# Patient Record
Sex: Male | Born: 1970 | Race: White | Hispanic: No | Marital: Married | Smoking: Never smoker
Health system: Southern US, Community
[De-identification: ages and names within clinical notes are randomized; demographics above are authoritative.]

## PROBLEM LIST (undated history)

## (undated) DIAGNOSIS — I517 Cardiomegaly: Secondary | ICD-10-CM

## (undated) DIAGNOSIS — I10 Essential (primary) hypertension: Secondary | ICD-10-CM

## (undated) DIAGNOSIS — L989 Disorder of the skin and subcutaneous tissue, unspecified: Secondary | ICD-10-CM

## (undated) DIAGNOSIS — N529 Male erectile dysfunction, unspecified: Secondary | ICD-10-CM

## (undated) DIAGNOSIS — E7800 Pure hypercholesterolemia, unspecified: Secondary | ICD-10-CM

## (undated) DIAGNOSIS — K219 Gastro-esophageal reflux disease without esophagitis: Secondary | ICD-10-CM

## (undated) HISTORY — DX: Gastro-esophageal reflux disease without esophagitis: K21.9

## (undated) HISTORY — DX: Male erectile dysfunction, unspecified: N52.9

## (undated) HISTORY — DX: Essential (primary) hypertension: I10

## (undated) HISTORY — DX: Disorder of the skin and subcutaneous tissue, unspecified: L98.9

## (undated) HISTORY — DX: Pure hypercholesterolemia, unspecified: E78.00

## (undated) HISTORY — PX: NO PAST SURGERIES: SHX2092

## (undated) HISTORY — DX: Cardiomegaly: I51.7

---

## 2002-11-18 ENCOUNTER — Encounter: Payer: Self-pay | Admitting: Unknown Physician Specialty

## 2002-11-18 ENCOUNTER — Encounter: Admission: RE | Admit: 2002-11-18 | Discharge: 2002-11-18 | Payer: Self-pay | Admitting: Unknown Physician Specialty

## 2015-04-17 ENCOUNTER — Ambulatory Visit (INDEPENDENT_AMBULATORY_CARE_PROVIDER_SITE_OTHER): Payer: BC Managed Care – PPO | Admitting: Podiatry

## 2015-04-17 ENCOUNTER — Ambulatory Visit (INDEPENDENT_AMBULATORY_CARE_PROVIDER_SITE_OTHER): Payer: BC Managed Care – PPO

## 2015-04-17 ENCOUNTER — Ambulatory Visit: Payer: BC Managed Care – PPO

## 2015-04-17 DIAGNOSIS — M722 Plantar fascial fibromatosis: Secondary | ICD-10-CM

## 2015-04-17 DIAGNOSIS — R52 Pain, unspecified: Secondary | ICD-10-CM | POA: Diagnosis not present

## 2015-04-17 MED ORDER — DICLOFENAC SODIUM 75 MG PO TBEC: 75.0000 mg | DELAYED_RELEASE_TABLET | Freq: Two times a day (BID) | ORAL | Status: DC

## 2015-04-17 MED ORDER — TRIAMCINOLONE ACETONIDE 10 MG/ML IJ SUSP
10.0000 mg | Freq: Once | INTRAMUSCULAR | Status: AC
  Administered 2015-04-17: 10 mg

## 2015-04-17 NOTE — Patient Instructions (Signed)

## 2015-04-17 NOTE — Progress Notes (Signed)
   Subjective:    Patient ID: Derek Ware, male    DOB: March 29, 1971, 44 y.o.   MRN: [id number]  HPI  I have been having arch and heel pain in my right foot since October but have started to have some pain in my left recently.     Review of Systems  All other systems reviewed and are negative.      Objective:   Physical Exam        Assessment & Plan:

## 2015-04-17 NOTE — Progress Notes (Signed)
Subjective:     Patient ID: Derek Ware, male   DOB: 04/14/1971, 44 y.o.   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      Objective:   Physical Exam  Constitutional: He is oriented to person, place, and time.  Cardiovascular: Intact distal pulses.   Musculoskeletal: Normal range of motion.  Neurological: He is oriented to person, place, and time.  Skin: Skin is warm.  Nursing note and vitals reviewed.  neurovascular status intact muscle strength adequate range of motion within normal limits with quite of discomfort in the plantar aspect right heel with inflammation fluid around the medial band and moderate depression of the arch. Patient's noted to be well perfused and is well oriented 3     Assessment:     Acute plantar fasciitis with pain right medial heel and moderate discomfort in the left heel    Plan:     H&P and x-rays reviewed with patient. Injected the right plantar fascia 3 mg Kenalog 5 mg Xylocaine and applied fascial brace with instructions. Placed on diclofenac and gave instructions on physical therapy

## 2015-05-01 ENCOUNTER — Encounter: Payer: Self-pay | Admitting: Podiatry

## 2015-05-01 ENCOUNTER — Ambulatory Visit (INDEPENDENT_AMBULATORY_CARE_PROVIDER_SITE_OTHER): Payer: BC Managed Care – PPO | Admitting: Podiatry

## 2015-05-01 VITALS — BP 134/92 | HR 60 | Resp 15

## 2015-05-01 DIAGNOSIS — M722 Plantar fascial fibromatosis: Secondary | ICD-10-CM | POA: Diagnosis not present

## 2015-05-02 NOTE — Progress Notes (Signed)
Subjective:     Patient ID: Derek Ware, male   DOB: 1970-11-11, 44 y.o.   MRN: [id number]  HPI patient presents stating I'm doing quite a bit better with a diminishment of discomfort upon movement   Review of Systems     Objective:   Physical Exam Neurovascular status intact with continued discomfort in the mid arch area but doing well with minimal pain upon palpation    Assessment:     Improvement from fasciitis with pain still present upon deep palpation    Plan:     Advised on physical therapy anti-inflammatory treatment and supportive shoe gear usage. Reappoint as needed

## 2016-07-18 DIAGNOSIS — R079 Chest pain, unspecified: Secondary | ICD-10-CM

## 2016-07-18 DIAGNOSIS — I10 Essential (primary) hypertension: Secondary | ICD-10-CM

## 2016-07-18 HISTORY — DX: Chest pain, unspecified: R07.9

## 2016-07-18 HISTORY — DX: Essential (primary) hypertension: I10

## 2021-01-26 ENCOUNTER — Encounter: Payer: Self-pay | Admitting: *Deleted

## 2021-01-26 ENCOUNTER — Encounter: Payer: Self-pay | Admitting: Cardiology

## 2021-02-13 ENCOUNTER — Other Ambulatory Visit: Payer: Self-pay

## 2021-02-13 ENCOUNTER — Ambulatory Visit: Payer: No Typology Code available for payment source | Admitting: Cardiology

## 2021-02-13 ENCOUNTER — Encounter: Payer: Self-pay | Admitting: Cardiology

## 2021-02-13 VITALS — BP 138/70 | HR 64 | Ht 73.0 in | Wt 292.0 lb

## 2021-02-13 DIAGNOSIS — I10 Essential (primary) hypertension: Secondary | ICD-10-CM | POA: Diagnosis not present

## 2021-02-13 DIAGNOSIS — R079 Chest pain, unspecified: Secondary | ICD-10-CM | POA: Diagnosis not present

## 2021-02-13 DIAGNOSIS — E785 Hyperlipidemia, unspecified: Secondary | ICD-10-CM

## 2021-02-13 HISTORY — DX: Hyperlipidemia, unspecified: E78.5

## 2021-02-13 NOTE — Progress Notes (Signed)
Cardiology Consultation:    Date:  02/13/2021   ID:  Derek Ware, DOB 1970/11/17, MRN [id number]  PCP:  Rolm Gala, NP  Cardiologist:  Gypsy Balsam, MD   Referring MD: Rolm Gala, NP   Chief Complaint  Patient presents with  . Hypertension  . Chest Pain    Believe it was heart burn    History of Present Illness:    Derek Ware is a 50 y.o. male who is being seen today for the evaluation of chest pain at the request of Rolm Gala, NP.  Gentleman with past medical history significant for essential hypertension, dyslipidemia, obesity.  Comes today to my office to be reestablished as a patient previously he was managed for hypertension.  About 2 weeks ago on the weekend he was sitting watching TV and started having stabbing-like sensation in the epigastrium taking deep breaths make the sensation worse also leaning forward make the sensation worse this sensation was on and off for few days eventually he ended up going to his primary care physician he was given proton pump inhibitor and he became completely asymptomatic.  Interestingly even today is that he got those episodes of chest pain he was able to walk climb stairs with absolutely no difficulties.  He does have high blood pressure he wants to stay healthy and he exercise walking with his wife on the regular basis he have no difficulty doing it.  There is no exertional chest pain there is no shortness of breath no tightness squeezing pressure burning chest.  No palpitations. He never smoke He does have family members with coronary artery disease but not premature.  All of them were smokers. He is on diet trying to keep up with Mediterranean diet. He exercised on the regular basis by walking with his wife.  Past Medical History:  Diagnosis Date  . Chest pain in adult 07/18/2016  . Elevated LDL cholesterol level   . Essential hypertension   . GERD without esophagitis   . Left atrial enlargement   . Precancerous skin  lesion     Past Surgical History:  Procedure Laterality Date  . NO PAST SURGERIES      Current Medications: Current Meds  Medication Sig  . losartan (COZAAR) 25 MG tablet Take 25 mg by mouth daily.  . pantoprazole (PROTONIX) 20 MG tablet Take 20 mg by mouth daily.  . pravastatin (PRAVACHOL) 10 MG tablet Take 10 mg by mouth once a week.     Allergies:   Patient has no known allergies.   Social History   Socioeconomic History  . Marital status: Married    Spouse name: Not on file  . Number of children: Not on file  . Years of education: Not on file  . Highest education level: Not on file  Occupational History  . Not on file  Tobacco Use  . Smoking status: Never Smoker  . Smokeless tobacco: Never Used  Substance and Sexual Activity  . Alcohol use: Yes    Alcohol/week: 1.0 standard drink    Types: 1 Cans of beer per week  . Drug use: Never  . Sexual activity: Yes  Other Topics Concern  . Not on file  Social History Narrative  . Not on file   Social Determinants of Health   Financial Resource Strain: Not on file  Food Insecurity: Not on file  Transportation Needs: Not on file  Physical Activity: Not on file  Stress: Not on file  Social Connections: Not  on file     Family History: The patient's family history includes Cancer in his brother; Heart disease in his mother; Heart failure in his mother; Hypertension in his father; Irritable bowel syndrome in his sister; Lung cancer in his father. ROS:   Please see the history of present illness.    All 14 point review of systems negative except as described per history of present illness.  EKGs/Labs/Other Studies Reviewed:    The following studies were reviewed today:   EKG:  EKG is  ordered today.  The ekg ordered today demonstrates normal sinus rhythm, incomplete right bundle branch block, cannot rule out anterior wall MI  Recent Labs: No results found for requested labs within last 8760 hours.  Recent Lipid  Panel No results found for: CHOL, TRIG, HDL, CHOLHDL, VLDL, LDLCALC, LDLDIRECT  Physical Exam:    VS:  BP 138/70 (BP Location: Left Arm, Patient Position: Sitting)   Pulse 64   Ht 6\' 1"  (1.854 m)   Wt 292 lb (132.5 kg)   SpO2 94%   BMI 38.52 kg/m     Wt Readings from Last 3 Encounters:  02/13/21 292 lb (132.5 kg)  01/24/21 295 lb (133.8 kg)     GEN:  Well nourished, well developed in no acute distress HEENT: Normal NECK: No JVD; No carotid bruits LYMPHATICS: No lymphadenopathy CARDIAC: RRR, no murmurs, no rubs, no gallops RESPIRATORY:  Clear to auscultation without rales, wheezing or rhonchi  ABDOMEN: Soft, non-tender, non-distended MUSCULOSKELETAL:  No edema; No deformity  SKIN: Warm and dry NEUROLOGIC:  Alert and oriented x 3 PSYCHIATRIC:  Normal affect   ASSESSMENT:    1. Chest pain in adult   2. Primary hypertension   3. Dyslipidemia    PLAN:    In order of problems listed above:  1. Atypical chest pain not related to exercise and relieved by proton pump inhibitor now completely asymptomatic I have low level suspicion that that was coronary event.  He is on baby aspirin which I advised to continue, he is also on proton pump inhibitor.  As a part of correlation I offer him calcium score that will also help for prognostic standpoint reviewed. 2. Dyslipidemia he takes pravastatin 10 mg once a week after mid count of unusual very low-dose.  We did calculated his 10 years risk for coronary event it can barely 5% which is low to intermediate.  I think doing calcium score will help in order to determine how aggressive we need to be in terms of management of his cholesterol.  In the meantime I asked him to start taking pravastatin 10 mg every single day. 3. Essential hypertension his blood pressure seems to be relatively well Continue present management.  I will ask her to have an echocardiogram to rule out possibility of anterior wall MI that is suggested by EKG as well as to  check for left ventricle hypertrophy. 4. We spent a great deal of time talking about risk factors modifications.  We did talk about need to exercise which she rated as I have told him that he need to exercise a little bit more aggressively to at least be short of breath when he does not, we did also discussed 1 more time basic of Mediterranean diet as well as avoidance of salty food.   Medication Adjustments/Labs and Tests Ordered: Current medicines are reviewed at length with the patient today.  Concerns regarding medicines are outlined above.  No orders of the defined types were placed  in this encounter.  No orders of the defined types were placed in this encounter.   Signed, Georgeanna Lea, MD, [facility]. 02/13/2021 9:23 AM    [facility]

## 2021-02-13 NOTE — Patient Instructions (Signed)
Medication Instructions:  Your physician recommends that you continue on your current medications as directed. Please refer to the Current Medication list given to you today.  *If you need a refill on your cardiac medications before your next appointment, please call your pharmacy*   Lab Work: None If you have labs (blood work) drawn today and your tests are completely normal, you will receive your results only by: Marland Kitchen MyChart Message (if you have MyChart) OR . A paper copy in the mail If you have any lab test that is abnormal or we need to change your treatment, we will call you to review the results.   Testing/Procedures: Non-Cardiac CT scanning, (CAT scanning), is a noninvasive, special x-ray that produces cross-sectional images of the body using x-rays and a computer. CT scans help physicians diagnose and treat medical conditions. For some CT exams, a contrast material is used to enhance visibility in the area of the body being studied. CT scans provide greater clarity and reveal more details than regular x-ray exams.  Your physician has requested that you have an echocardiogram. Echocardiography is a painless test that uses sound waves to create images of your heart. It provides your doctor with information about the size and shape of your heart and how well your heart's chambers and valves are working. This procedure takes approximately one hour. There are no restrictions for this procedure.      Follow-Up: At [facility], you and your health needs are our priority.  As part of our continuing mission to provide you with exceptional heart care, we have created designated Provider Care Teams.  These Care Teams include your primary Cardiologist (physician) and Advanced Practice Providers (APPs -  Physician Assistants and Nurse Practitioners) who all work together to provide you with the care you need, when you need it.  We recommend signing up for the patient portal called "MyChart".   Sign up information is provided on this After Visit Summary.  MyChart is used to connect with patients for Virtual Visits (Telemedicine).  Patients are able to view lab/test results, encounter notes, upcoming appointments, etc.  Non-urgent messages can be sent to your provider as well.   To learn more about what you can do with MyChart, go to [email or web address].    Your next appointment:   4 month(s)  The format for your next appointment:   In Person  Provider:   Gypsy Balsam, MD   Other Instructions   Echocardiogram An echocardiogram is a test that uses sound waves (ultrasound) to produce images of the heart. Images from an echocardiogram can provide important information about:  Heart size and shape.  The size and thickness and movement of your heart's walls.  Heart muscle function and strength.  Heart valve function or if you have stenosis. Stenosis is when the heart valves are too narrow.  If blood is flowing backward through the heart valves (regurgitation).  A tumor or infectious growth around the heart valves.  Areas of heart muscle that are not working well because of poor blood flow or injury from a heart attack.  Aneurysm detection. An aneurysm is a weak or damaged part of an artery wall. The wall bulges out from the normal force of blood pumping through the body. Tell a health care provider about:  Any allergies you have.  All medicines you are taking, including vitamins, herbs, eye drops, creams, and over-the-counter medicines.  Any blood disorders you have.  Any surgeries you have had.  Any medical conditions  you have.  Whether you are pregnant or may be pregnant. What are the risks? Generally, this is a safe test. However, problems may occur, including an allergic reaction to dye (contrast) that may be used during the test. What happens before the test? No specific preparation is needed. You may eat and drink normally. What happens during the  test?  You will take off your clothes from the waist up and put on a hospital gown.  Electrodes or electrocardiogram (ECG)patches may be placed on your chest. The electrodes or patches are then connected to a device that monitors your heart rate and rhythm.  You will lie down on a table for an ultrasound exam. A gel will be applied to your chest to help sound waves pass through your skin.  A handheld device, called a transducer, will be pressed against your chest and moved over your heart. The transducer produces sound waves that travel to your heart and bounce back (or "echo" back) to the transducer. These sound waves will be captured in real-time and changed into images of your heart that can be viewed on a video monitor. The images will be recorded on a computer and reviewed by your health care provider.  You may be asked to change positions or hold your breath for a short time. This makes it easier to get different views or better views of your heart.  In some cases, you may receive contrast through an IV in one of your veins. This can improve the quality of the pictures from your heart. The procedure may vary among health care providers and hospitals.   What can I expect after the test? You may return to your normal, everyday life, including diet, activities, and medicines, unless your health care provider tells you not to do that. Follow these instructions at home:  It is up to you to get the results of your test. Ask your health care provider, or the department that is doing the test, when your results will be ready.  Keep all follow-up visits. This is important. Summary  An echocardiogram is a test that uses sound waves (ultrasound) to produce images of the heart.  Images from an echocardiogram can provide important information about the size and shape of your heart, heart muscle function, heart valve function, and other possible heart problems.  You do not need to do anything to  prepare before this test. You may eat and drink normally.  After the echocardiogram is completed, you may return to your normal, everyday life, unless your health care provider tells you not to do that. This information is not intended to replace advice given to you by your health care provider. Make sure you discuss any questions you have with your health care provider. Document Revised: 05/30/2020 Document Reviewed: 05/30/2020 Elsevier Patient Education  2021 [employer].

## 2021-02-23 ENCOUNTER — Other Ambulatory Visit: Payer: Self-pay

## 2021-02-23 ENCOUNTER — Ambulatory Visit (INDEPENDENT_AMBULATORY_CARE_PROVIDER_SITE_OTHER): Payer: No Typology Code available for payment source

## 2021-02-23 DIAGNOSIS — I10 Essential (primary) hypertension: Secondary | ICD-10-CM

## 2021-02-23 DIAGNOSIS — E785 Hyperlipidemia, unspecified: Secondary | ICD-10-CM

## 2021-02-23 DIAGNOSIS — R079 Chest pain, unspecified: Secondary | ICD-10-CM

## 2021-02-23 LAB — ECHOCARDIOGRAM COMPLETE
Area-P 1/2: 3.21 cm2
S' Lateral: 2.9 cm

## 2021-02-23 NOTE — Progress Notes (Signed)
Complete echocardiogram performed.  Jimmy Delmy Holdren RDCS, RVT  

## 2021-03-01 ENCOUNTER — Other Ambulatory Visit: Payer: Self-pay

## 2021-03-01 ENCOUNTER — Ambulatory Visit (INDEPENDENT_AMBULATORY_CARE_PROVIDER_SITE_OTHER)
Admission: RE | Admit: 2021-03-01 | Discharge: 2021-03-01 | Disposition: A | Discharge status: Home / Self Care | Payer: Self-pay | Source: Ambulatory Visit | Attending: Cardiology | Admitting: Cardiology

## 2021-03-01 DIAGNOSIS — R079 Chest pain, unspecified: Secondary | ICD-10-CM

## 2021-03-26 ENCOUNTER — Other Ambulatory Visit: Payer: Self-pay

## 2021-03-26 ENCOUNTER — Ambulatory Visit: Payer: No Typology Code available for payment source | Admitting: Cardiology

## 2021-03-26 DIAGNOSIS — I517 Cardiomegaly: Secondary | ICD-10-CM | POA: Insufficient documentation

## 2021-03-26 DIAGNOSIS — I10 Essential (primary) hypertension: Secondary | ICD-10-CM | POA: Insufficient documentation

## 2021-03-26 DIAGNOSIS — K219 Gastro-esophageal reflux disease without esophagitis: Secondary | ICD-10-CM | POA: Insufficient documentation

## 2021-03-26 DIAGNOSIS — E7800 Pure hypercholesterolemia, unspecified: Secondary | ICD-10-CM | POA: Insufficient documentation

## 2021-03-26 DIAGNOSIS — L989 Disorder of the skin and subcutaneous tissue, unspecified: Secondary | ICD-10-CM | POA: Insufficient documentation

## 2021-03-28 ENCOUNTER — Encounter: Payer: Self-pay | Admitting: Cardiology

## 2021-03-28 ENCOUNTER — Ambulatory Visit: Payer: No Typology Code available for payment source | Admitting: Cardiology

## 2021-03-28 ENCOUNTER — Other Ambulatory Visit: Payer: Self-pay

## 2021-03-28 VITALS — BP 138/72 | HR 71 | Ht 73.0 in | Wt 288.0 lb

## 2021-03-28 DIAGNOSIS — I10 Essential (primary) hypertension: Secondary | ICD-10-CM | POA: Diagnosis not present

## 2021-03-28 DIAGNOSIS — E785 Hyperlipidemia, unspecified: Secondary | ICD-10-CM | POA: Diagnosis not present

## 2021-03-28 DIAGNOSIS — I2510 Atherosclerotic heart disease of native coronary artery without angina pectoris: Secondary | ICD-10-CM

## 2021-03-28 DIAGNOSIS — E7800 Pure hypercholesterolemia, unspecified: Secondary | ICD-10-CM | POA: Diagnosis not present

## 2021-03-28 HISTORY — DX: Atherosclerotic heart disease of native coronary artery without angina pectoris: I25.10

## 2021-03-28 NOTE — Progress Notes (Signed)
Cardiology Office Note:    Date:  03/28/2021   ID:  Derek Ware, DOB 1970-11-14, MRN [id number]  PCP:  Rolm Gala, NP  Cardiologist:  Gypsy Balsam, MD    Referring MD: Rolm Gala, NP   Chief Complaint  Patient presents with  . Follow-up    results  I am doing fine  History of Present Illness:    Derek Ware is a 50 y.o. male who was referred to [place] originally because of atypical chest pain.  However he was given proton pump inhibitor all his symptoms resolved completely.  However as a part of evaluation he ended up having calcium score of his coronary arteries calcium score came 155 which is elevated.  I brought him to my office to talk about it.  Overall he is doing well he exercised on the regular basis have Ware difficulty doing it.  Denies have any chest pain tightness squeezing pressure burning chest.  Also last time I seen him he was taking 10 mg of pravastatin maybe twice a week I encouraged him to take it every single day which he does.  Concerning is the fact that CT of the chest noncardiac portion of showed some evidence of either pneumonitis may be been ongoing pneumonia however he was completely asymptomatic when the test was done.  I strongly recommended for him to talk to primary care physician about it.  Past Medical History:  Diagnosis Date  . Chest pain in adult 07/18/2016  . Elevated LDL cholesterol level   . Essential hypertension   . GERD without esophagitis   . Left atrial enlargement   . Precancerous skin lesion     Past Surgical History:  Procedure Laterality Date  . Ware PAST SURGERIES      Current Medications: Current Meds  Medication Sig  . aspirin EC 81 MG tablet Take 81 mg by mouth daily. Swallow whole.  . brimonidine (ALPHAGAN) 0.2 % ophthalmic solution Place 1 drop into the left eye in the morning, at noon, and at bedtime.  Marland Kitchen losartan (COZAAR) 25 MG tablet Take 25 mg by mouth daily.  . pravastatin (PRAVACHOL) 10 MG tablet Take 10 mg by  mouth once a week.  . [DISCONTINUED] pantoprazole (PROTONIX) 20 MG tablet Take 20 mg by mouth daily.     Allergies:   Patient has Ware known allergies.   Social History   Socioeconomic History  . Marital status: Married    Spouse name: Not on file  . Number of children: Not on file  . Years of education: Not on file  . Highest education level: Not on file  Occupational History  . Not on file  Tobacco Use  . Smoking status: Never Smoker  . Smokeless tobacco: Never Used  Substance and Sexual Activity  . Alcohol use: Yes    Alcohol/week: 1.0 standard drink    Types: 1 Cans of beer per week  . Drug use: Never  . Sexual activity: Yes  Other Topics Concern  . Not on file  Social History Narrative  . Not on file   Social Determinants of Health   Financial Resource Strain: Not on file  Food Insecurity: Not on file  Transportation Needs: Not on file  Physical Activity: Not on file  Stress: Not on file  Social Connections: Not on file     Family History: The patient's family history includes Cancer in his brother; Heart disease in his mother; Heart failure in his mother; Hypertension in his father;  Irritable bowel syndrome in his sister; Lung cancer in his father. ROS:   Please see the history of present illness.    All 14 point review of systems negative except as described per history of present illness  EKGs/Labs/Other Studies Reviewed:      Recent Labs: Ware results found for requested labs within last 8760 hours.  Recent Lipid Panel Ware results found for: CHOL, TRIG, HDL, CHOLHDL, VLDL, LDLCALC, LDLDIRECT  Physical Exam:    VS:  BP 138/72 (BP Location: Right Arm, Patient Position: Sitting)   Pulse 71   Ht 6\' 1"  (1.854 m)   Wt 288 lb (130.6 kg)   SpO2 94%   BMI 38.00 kg/m     Wt Readings from Last 3 Encounters:  03/28/21 288 lb (130.6 kg)  02/13/21 292 lb (132.5 kg)  01/24/21 295 lb (133.8 kg)     GEN:  Well nourished, well developed in Ware acute  distress HEENT: Normal NECK: Ware JVD; Ware carotid bruits LYMPHATICS: Ware lymphadenopathy CARDIAC: RRR, Ware murmurs, Ware rubs, Ware gallops RESPIRATORY:  Clear to auscultation without rales, wheezing or rhonchi  ABDOMEN: Soft, non-tender, non-distended MUSCULOSKELETAL:  Ware edema; Ware deformity  SKIN: Warm and dry LOWER EXTREMITIES: Ware swelling NEUROLOGIC:  Alert and oriented x 3 PSYCHIATRIC:  Normal affect   ASSESSMENT:    1. Dyslipidemia   2. Coronary artery disease involving native coronary artery of native heart without angina pectoris   3. Essential hypertension   4. Elevated LDL cholesterol level    PLAN:    In order of problems listed above:  1. Coronary artery disease calcium score 155, completely asymptomatic.  He is on aspirin as well as statin which I will continue.  In about 3 weeks we will repeat his cholesterol to make sure that he is dose of pravastatin which is really very small insufficient my feeling is that we will have to go to a higher dose. 2. Essential hypertension blood pressure well controlled continue present management. 3. Dyslipidemia again plan is to recheck his fasting lipid profile when he takes full dose of statin. 4. Abnormality in the lung parenchyma detected on CT.  I strongly recommend to talk to primary care physician about this I gave him a Paper copy of his test and ask him to show it to his primary care physician.  I expect him to need to see pulmonologist   Medication Adjustments/Labs and Tests Ordered: Current medicines are reviewed at length with the patient today.  Concerns regarding medicines are outlined above.  Orders Placed This Encounter  Procedures  . Lipid panel   Medication changes: Ware orders of the defined types were placed in this encounter.   Signed, 03/26/21, MD, [facility] 03/28/2021 3:19 PM    [facility]

## 2021-03-28 NOTE — Patient Instructions (Signed)
Medication Instructions:  Your physician recommends that you continue on your current medications as directed. Please refer to the Current Medication list given to you today.  *If you need a refill on your cardiac medications before your next appointment, please call your pharmacy*   Lab Work: Your physician recommends that you return for lab work in  6 weeks: lipid  If you have labs (blood work) drawn today and your tests are completely normal, you will receive your results only by: MyChart Message (if you have MyChart) OR A paper copy in the mail If you have any lab test that is abnormal or we need to change your treatment, we will call you to review the results.   Testing/Procedures: None   Follow-Up: At [facility], you and your health needs are our priority.  As part of our continuing mission to provide you with exceptional heart care, we have created designated Provider Care Teams.  These Care Teams include your primary Cardiologist (physician) and Advanced Practice Providers (APPs -  Physician Assistants and Nurse Practitioners) who all work together to provide you with the care you need, when you need it.  We recommend signing up for the patient portal called "MyChart".  Sign up information is provided on this After Visit Summary.  MyChart is used to connect with patients for Virtual Visits (Telemedicine).  Patients are able to view lab/test results, encounter notes, upcoming appointments, etc.  Non-urgent messages can be sent to your provider as well.   To learn more about what you can do with MyChart, go to [email or web address].    Your next appointment:   6 month(s)  The format for your next appointment:   In Person  Provider:   Robert Krasowski, MD   Other Instructions   

## 2021-06-29 ENCOUNTER — Ambulatory Visit: Payer: No Typology Code available for payment source | Admitting: Cardiology

## 2021-07-27 ENCOUNTER — Ambulatory Visit: Payer: BC Managed Care – PPO | Admitting: Cardiology

## 2021-07-27 ENCOUNTER — Other Ambulatory Visit: Payer: Self-pay

## 2021-07-27 ENCOUNTER — Encounter: Payer: Self-pay | Admitting: Cardiology

## 2021-07-27 VITALS — BP 140/90 | HR 65 | Ht 73.0 in | Wt 260.4 lb

## 2021-07-27 DIAGNOSIS — I2510 Atherosclerotic heart disease of native coronary artery without angina pectoris: Secondary | ICD-10-CM

## 2021-07-27 DIAGNOSIS — I10 Essential (primary) hypertension: Secondary | ICD-10-CM | POA: Diagnosis not present

## 2021-07-27 DIAGNOSIS — E785 Hyperlipidemia, unspecified: Secondary | ICD-10-CM | POA: Diagnosis not present

## 2021-07-27 NOTE — Progress Notes (Signed)
Cardiology Office Note:    Date:  07/27/2021   ID:  Derek Ware, DOB 1971-07-08, MRN [id number]  PCP:  Rolm Gala, NP  Cardiologist:  Gypsy Balsam, MD    Referring MD: Rolm Gala, NP   Chief Complaint  Patient presents with   Follow-up  I am doing very well  History of Present Illness:    Derek Ware is a 50 y.o. male  who was referred to [place] originally because of atypical chest pain.  However he was given proton pump inhibitor all his symptoms resolved completely.  However as a part of evaluation he ended up having calcium score of his coronary arteries calcium score came 155 which is elevated.  I brought him to my office to talk about it.  Overall he is doing well he exercised on the regular basis have no difficulty doing it.  Denies have any chest pain tightness squeezing pressure burning chest.  Also last time I seen him he was taking 10 mg of pravastatin maybe twice a week advised him at that time to start taking every single day but today he comes he tells me he does take it only once a week.  I asked him why is that he said because he does not have any problem I explained to him 1 more time what we find on the CT calcium score being 155 I have explained 1 more time that reduction of cholesterol which is elevated in his case will be very beneficial.  Hopefully he will comply I advised him of course to take pravastatin every single day.  Past Medical History:  Diagnosis Date   Chest pain in adult 07/18/2016   Elevated LDL cholesterol level    Essential hypertension    GERD without esophagitis    Left atrial enlargement    Precancerous skin lesion     Past Surgical History:  Procedure Laterality Date   NO PAST SURGERIES      Current Medications: Current Meds  Medication Sig   aspirin EC 81 MG tablet Take 81 mg by mouth daily. Swallow whole.   brimonidine (ALPHAGAN) 0.2 % ophthalmic solution Place 1 drop into the left eye in the morning, at noon, and at bedtime.    losartan (COZAAR) 25 MG tablet Take 25 mg by mouth daily.   pravastatin (PRAVACHOL) 10 MG tablet Take 10 mg by mouth once a week.     Allergies:   Patient has no known allergies.   Social History   Socioeconomic History   Marital status: Married    Spouse name: Not on file   Number of children: Not on file   Years of education: Not on file   Highest education level: Not on file  Occupational History   Not on file  Tobacco Use   Smoking status: Never   Smokeless tobacco: Never  Substance and Sexual Activity   Alcohol use: Yes    Alcohol/week: 1.0 standard drink    Types: 1 Cans of beer per week   Drug use: Never   Sexual activity: Yes  Other Topics Concern   Not on file  Social History Narrative   Not on file   Social Determinants of Health   Financial Resource Strain: Not on file  Food Insecurity: Not on file  Transportation Needs: Not on file  Physical Activity: Not on file  Stress: Not on file  Social Connections: Not on file     Family History: The patient's family history includes Cancer  in his brother; Heart disease in his mother; Heart failure in his mother; Hypertension in his father; Irritable bowel syndrome in his sister; Lung cancer in his father. ROS:   Please see the history of present illness.    All 14 point review of systems negative except as described per history of present illness  EKGs/Labs/Other Studies Reviewed:      Recent Labs: No results found for requested labs within last 8760 hours.  Recent Lipid Panel No results found for: CHOL, TRIG, HDL, CHOLHDL, VLDL, LDLCALC, LDLDIRECT  Physical Exam:    VS:  BP 140/90 (BP Location: Left Arm, Patient Position: Sitting)   Pulse 65   Ht 6\' 1"  (1.854 m)   Wt 260 lb 6.4 oz (118.1 kg)   SpO2 95%   BMI 34.36 kg/m     Wt Readings from Last 3 Encounters:  07/27/21 260 lb 6.4 oz (118.1 kg)  03/28/21 288 lb (130.6 kg)  02/13/21 292 lb (132.5 kg)     GEN:  Well nourished, well developed in  no acute distress HEENT: Normal NECK: No JVD; No carotid bruits LYMPHATICS: No lymphadenopathy CARDIAC: RRR, no murmurs, no rubs, no gallops RESPIRATORY:  Clear to auscultation without rales, wheezing or rhonchi  ABDOMEN: Soft, non-tender, non-distended MUSCULOSKELETAL:  No edema; No deformity  SKIN: Warm and dry LOWER EXTREMITIES: no swelling NEUROLOGIC:  Alert and oriented x 3 PSYCHIATRIC:  Normal affect   ASSESSMENT:    1. Coronary artery disease involving native coronary artery of native heart without angina pectoris   2. Primary hypertension   3. Dyslipidemia    PLAN:    In order of problems listed above:  Coronary disease calcification of the coronary arteries likely asymptomatic he is risk factors modification he does not take statin I strongly advised him to start taking pravastatin every single day we will recheck his fasting lipid profile 6 weeks Essential hypertension elevated but he said it is always elevated in in the morning he is taking Cozaar already will increase dose to 50 mg daily Dyslipidemia plan as described above, I did review his K PN which show me LDL of 134 HDL 59 will increase dose of pravastatin   Medication Adjustments/Labs and Tests Ordered: Current medicines are reviewed at length with the patient today.  Concerns regarding medicines are outlined above.  No orders of the defined types were placed in this encounter.  Medication changes: No orders of the defined types were placed in this encounter.   Signed, 02/15/21, MD, [facility] 07/27/2021 9:47 AM    [facility]

## 2021-07-27 NOTE — Patient Instructions (Signed)
Medication Instructions:  Your physician has recommended you make the following change in your medication:  INCREASE: Pravastatin 10 mg take one tablet by mouth daily.  *If you need a refill on your cardiac medications before your next appointment, please call your pharmacy*   Lab Work: Your physician recommends that you return for lab work in: 6 weeks AST, ALT, Lipids If you have labs (blood work) drawn today and your tests are completely normal, you will receive your results only by: MyChart Message (if you have MyChart) OR A paper copy in the mail If you have any lab test that is abnormal or we need to change your treatment, we will call you to review the results.   Testing/Procedures: None   Follow-Up: At [facility], you and your health needs are our priority.  As part of our continuing mission to provide you with exceptional heart care, we have created designated Provider Care Teams.  These Care Teams include your primary Cardiologist (physician) and Advanced Practice Providers (APPs -  Physician Assistants and Nurse Practitioners) who all work together to provide you with the care you need, when you need it.  We recommend signing up for the patient portal called "MyChart".  Sign up information is provided on this After Visit Summary.  MyChart is used to connect with patients for Virtual Visits (Telemedicine).  Patients are able to view lab/test results, encounter notes, upcoming appointments, etc.  Non-urgent messages can be sent to your provider as well.   To learn more about what you can do with MyChart, go to [email or web address].    Your next appointment:   6 month(s)  The format for your next appointment:   In Person  Provider:   Gypsy Balsam, MD   Other Instructions

## 2021-09-28 ENCOUNTER — Ambulatory Visit: Payer: No Typology Code available for payment source | Admitting: Cardiology

## 2022-01-24 ENCOUNTER — Ambulatory Visit: Payer: BC Managed Care – PPO | Admitting: Cardiology

## 2022-01-24 ENCOUNTER — Encounter: Payer: Self-pay | Admitting: Cardiology

## 2022-01-24 VITALS — BP 128/72 | HR 71 | Ht 73.0 in | Wt 282.4 lb

## 2022-01-24 DIAGNOSIS — I2510 Atherosclerotic heart disease of native coronary artery without angina pectoris: Secondary | ICD-10-CM

## 2022-01-24 DIAGNOSIS — I10 Essential (primary) hypertension: Secondary | ICD-10-CM | POA: Diagnosis not present

## 2022-01-24 DIAGNOSIS — E785 Hyperlipidemia, unspecified: Secondary | ICD-10-CM

## 2022-01-24 NOTE — Progress Notes (Signed)
?Cardiology Office Note:   ? ?Date:  01/24/2022  ? ?ID:  Derek Ware, DOB 1971-10-10, MRN [id number] ? ?PCP:  Rolm Gala, NP  ?Cardiologist:  Gypsy Balsam, MD   ? ?Referring MD: Rolm Gala, NP  ? ?Chief Complaint  ?Patient presents with  ? Follow-up  ?I am doing fine ? ?History of Present Illness:   ? ?Derek Ware is a 51 y.o. male with a past medical history significant for elevated calcium score of 155 in this 50 nasal gentleman, essential hypertension, dyslipidemia.  He comes today to my office for regular follow-up.  Overall things are well.  He denies have any chest pain tightness squeezing pressure burning chest he can walk with no difficulty except for the fungal few weeks ago he injured his little toe on the right food he thinks it is broken gradually getting better and he is gradually getting back to his exercises.  Denies have any chest pain tightness squeezing pressure burning chest. ? ?Past Medical History:  ?Diagnosis Date  ? Chest pain in adult 07/18/2016  ? Elevated LDL cholesterol level   ? Essential hypertension   ? GERD without esophagitis   ? Left atrial enlargement   ? Precancerous skin lesion   ? ? ?Past Surgical History:  ?Procedure Laterality Date  ? NO PAST SURGERIES    ? ? ?Current Medications: ?Current Meds  ?Medication Sig  ? aspirin EC 81 MG tablet Take 81 mg by mouth daily. Swallow whole.  ? losartan (COZAAR) 25 MG tablet Take 25 mg by mouth daily.  ? pravastatin (PRAVACHOL) 10 MG tablet Take 10 mg by mouth daily.  ? [DISCONTINUED] brimonidine (ALPHAGAN) 0.2 % ophthalmic solution Place 1 drop into the left eye in the morning, at noon, and at bedtime.  ?  ? ?Allergies:   Patient has no known allergies.  ? ?Social History  ? ?Socioeconomic History  ? Marital status: Married  ?  Spouse name: Not on file  ? Number of children: Not on file  ? Years of education: Not on file  ? Highest education level: Not on file  ?Occupational History  ? Not on file  ?Tobacco Use  ? Smoking  status: Never  ? Smokeless tobacco: Never  ?Substance and Sexual Activity  ? Alcohol use: Yes  ?  Alcohol/week: 1.0 standard drink  ?  Types: 1 Cans of beer per week  ? Drug use: Never  ? Sexual activity: Yes  ?Other Topics Concern  ? Not on file  ?Social History Narrative  ? Not on file  ? ?Social Determinants of Health  ? ?Financial Resource Strain: Not on file  ?Food Insecurity: Not on file  ?Transportation Needs: Not on file  ?Physical Activity: Not on file  ?Stress: Not on file  ?Social Connections: Not on file  ?  ? ?Family History: ?The patient's family history includes Cancer in his brother; Heart disease in his mother; Heart failure in his mother; Hypertension in his father; Irritable bowel syndrome in his sister; Lung cancer in his father. ?ROS:   ?Please see the history of present illness.    ?All 14 point review of systems negative except as described per history of present illness ? ?EKGs/Labs/Other Studies Reviewed:   ? ? ? ?Recent Labs: ?No results found for requested labs within last 8760 hours.  ?Recent Lipid Panel ?No results found for: CHOL, TRIG, HDL, CHOLHDL, VLDL, LDLCALC, LDLDIRECT ? ?Physical Exam:   ? ?VS:  BP 128/72 (BP Location: Left Arm,  Patient Position: Sitting)   Pulse 71   Ht 6\' 1"  (1.854 m)   Wt 282 lb 6.4 oz (128.1 kg)   SpO2 95%   BMI 37.26 kg/m?    ? ?Wt Readings from Last 3 Encounters:  ?01/24/22 282 lb 6.4 oz (128.1 kg)  ?07/27/21 260 lb 6.4 oz (118.1 kg)  ?03/28/21 288 lb (130.6 kg)  ?  ? ?GEN:  Well nourished, well developed in no acute distress ?HEENT: Normal ?NECK: No JVD; No carotid bruits ?LYMPHATICS: No lymphadenopathy ?CARDIAC: RRR, no murmurs, no rubs, no gallops ?RESPIRATORY:  Clear to auscultation without rales, wheezing or rhonchi  ?ABDOMEN: Soft, non-tender, non-distended ?MUSCULOSKELETAL:  No edema; No deformity  ?SKIN: Warm and dry ?LOWER EXTREMITIES: no swelling ?NEUROLOGIC:  Alert and oriented x 3 ?PSYCHIATRIC:  Normal affect  ? ?ASSESSMENT:   ? ?1.  Coronary artery disease involving native coronary artery of native heart without angina pectoris   ?2. Primary hypertension   ?3. Dyslipidemia   ? ?PLAN:   ? ?In order of problems listed above: ? ?Coronary disease as proven by calcium score being positive.  He is completely asymptomatic decreased risk factors modifications, he is already on antiplatelet therapy in form of aspirin 81 which I recommend to continue.  He is also on statin before he had difficulty tolerating it now he is taking 10 mg pravastatin daily we will recheck his fasting lipid profile.  I did review K PN which show me his LDL 134 HDL 59 but this is from year ago.  Again that study will be repeated. ?Essential hypertension: Blood pressure well controlled continue present management. ?Dyslipidemia: Plan as described above. ?We discussed healthy lifestyle need to exercise on the regular basis and good diet which she is already practicing.  He does not smoke ? ? ?Medication Adjustments/Labs and Tests Ordered: ?Current medicines are reviewed at length with the patient today.  Concerns regarding medicines are outlined above.  ?No orders of the defined types were placed in this encounter. ? ?Medication changes: No orders of the defined types were placed in this encounter. ? ? ?Signed, ?05/28/21, MD, [facility] ?01/24/2022 8:48 AM    ?[facility] ?

## 2022-01-24 NOTE — Patient Instructions (Signed)
Medication Instructions:  ?Your physician recommends that you continue on your current medications as directed. Please refer to the Current Medication list given to you today. ? ?*If you need a refill on your cardiac medications before your next appointment, please call your pharmacy* ? ? ?Lab Work: ?Your physician recommends that you return for lab work in:  ? ?Labs today: Lipid, LFT's ? ?If you have labs (blood work) drawn today and your tests are completely normal, you will receive your results only by: ?MyChart Message (if you have MyChart) OR ?A paper copy in the mail ?If you have any lab test that is abnormal or we need to change your treatment, we will call you to review the results. ? ? ?Testing/Procedures: ?None ? ? ?Follow-Up: ?At [facility], you and your health needs are our priority.  As part of our continuing mission to provide you with exceptional heart care, we have created designated Provider Care Teams.  These Care Teams include your primary Cardiologist (physician) and Advanced Practice Providers (APPs -  Physician Assistants and Nurse Practitioners) who all work together to provide you with the care you need, when you need it. ? ?We recommend signing up for the patient portal called "MyChart".  Sign up information is provided on this After Visit Summary.  MyChart is used to connect with patients for Virtual Visits (Telemedicine).  Patients are able to view lab/test results, encounter notes, upcoming appointments, etc.  Non-urgent messages can be sent to your provider as well.   ?To learn more about what you can do with MyChart, go to [email or web address].   ? ?Your next appointment:   ?1 year(s) ? ?The format for your next appointment:   ?In Person ? ?Provider:   ?Jenne Campus, MD  ? ? ?Other Instructions ?None ? ?

## 2022-01-25 ENCOUNTER — Telehealth: Payer: Self-pay

## 2022-01-25 DIAGNOSIS — E785 Hyperlipidemia, unspecified: Secondary | ICD-10-CM

## 2022-01-25 LAB — HEPATIC FUNCTION PANEL
ALT: 38 IU/L (ref 0–44)
AST: 34 IU/L (ref 0–40)
Albumin: 4.5 g/dL (ref 3.8–4.9)
Alkaline Phosphatase: 56 IU/L (ref 44–121)
Bilirubin Total: 0.4 mg/dL (ref 0.0–1.2)
Bilirubin, Direct: 0.12 mg/dL (ref 0.00–0.40)
Total Protein: 6.4 g/dL (ref 6.0–8.5)

## 2022-01-25 LAB — LIPID PANEL
Chol/HDL Ratio: 4.3 ratio (ref 0.0–5.0)
Cholesterol, Total: 239 mg/dL — ABNORMAL HIGH (ref 100–199)
HDL: 55 mg/dL (ref >39)
LDL Chol Calc (NIH): 172 mg/dL — ABNORMAL HIGH (ref 0–99)
Triglycerides: 71 mg/dL (ref 0–149)
VLDL Cholesterol Cal: 12 mg/dL (ref 5–40)

## 2022-01-25 MED ORDER — PRAVASTATIN SODIUM 80 MG PO TABS: 80.0000 mg | ORAL_TABLET | Freq: Every evening | ORAL | 1 refills | Status: DC

## 2022-01-25 NOTE — Telephone Encounter (Signed)
-----   Message from Robert J Krasowski, MD sent at 01/25/2022  9:46 AM EDT ----- ?Cholesterol far from where it supposed to be.  Please increase his pravastatin to 80 mg daily, fasting lipid profile, AST LT within 6 weeks ?

## 2022-01-25 NOTE — Telephone Encounter (Signed)
Patient notified of results and recommendations. Rx sent and he is aware to be fasting for blood work.  ?

## 2022-01-25 NOTE — Telephone Encounter (Signed)
-----   Message from Park Liter, MD sent at 01/25/2022  9:46 AM EDT ----- ?Cholesterol far from where it supposed to be.  Please increase his pravastatin to 80 mg daily, fasting lipid profile, AST LT within 6 weeks ?

## 2022-09-13 IMAGING — CT CT CARDIAC CORONARY ARTERY CALCIUM SCORE
3 series · 14 of 20 positions shown, 15 images · non-contrast
Comparison: None.
COMPARISON: None.

Addendum:
EXAM:
OVER-READ INTERPRETATION  CT CHEST

The following report is an over-read performed by radiologist Dr.
Clerio Lv [REDACTED] on 03/01/2021. This
over-read does not include interpretation of cardiac or coronary
anatomy or pathology. The coronary calcium score interpretation by
the cardiologist is attached.
CLINICAL DATA: 49M with hypertension, hyperlipidemia and atypical
chest pain for cardiovascular disease risk stratification
Coronary Calcium Score
TECHNIQUE: A gated, non-contrast computed tomography scan of the heart was
performed using 3mm slice thickness. Axial images were analyzed on a
dedicated workstation. Calcium scoring of the coronary arteries was
performed using the Agatston method.

[Series 2: casc 3.0 bv41 2 bestdiast 70 % · axial · 0.46mm/px · z∈[+210,+297]mm · 4 of 49 slices shown, 5 images]
[im 10/49  vessel]
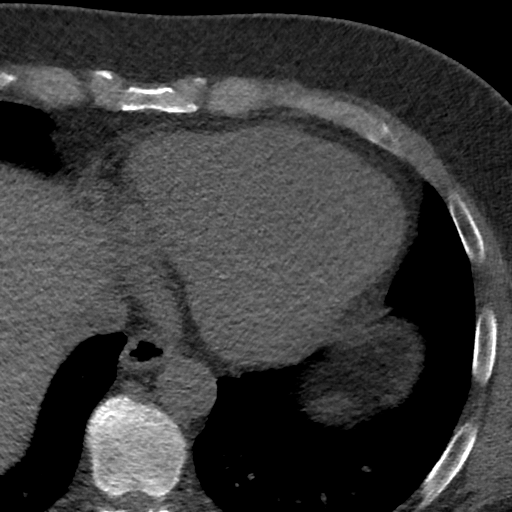
[im 10/49  lung]
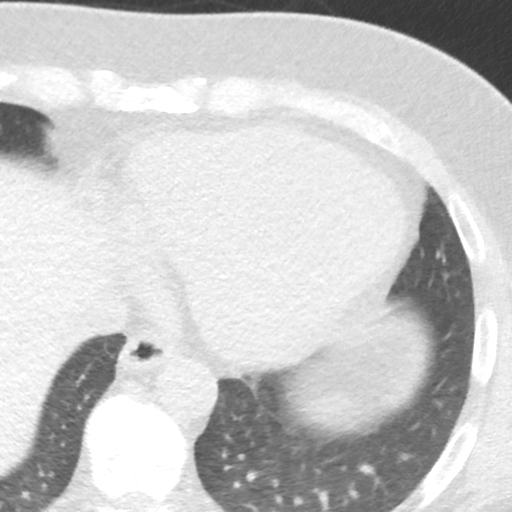
[im 20/49  vessel]
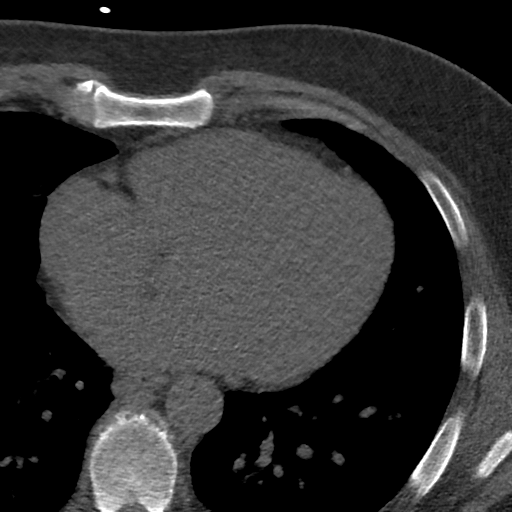
[im 29/49  vessel]
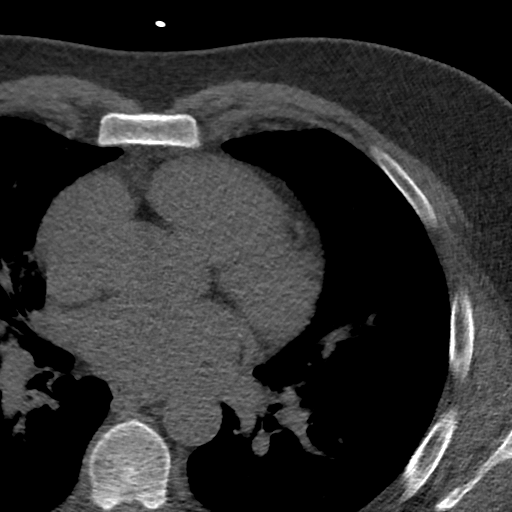
[im 39/49  vessel]
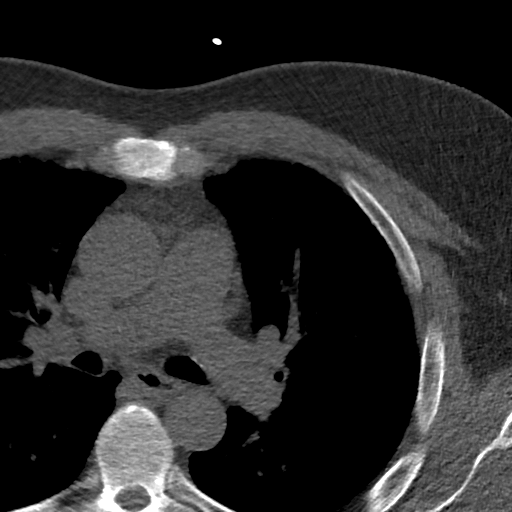

[Series 3: lung 70 % · axial · 0.80mm/px · z∈[+207,+303]mm · 5 of 49 slices shown]
[im 9/49  lung]
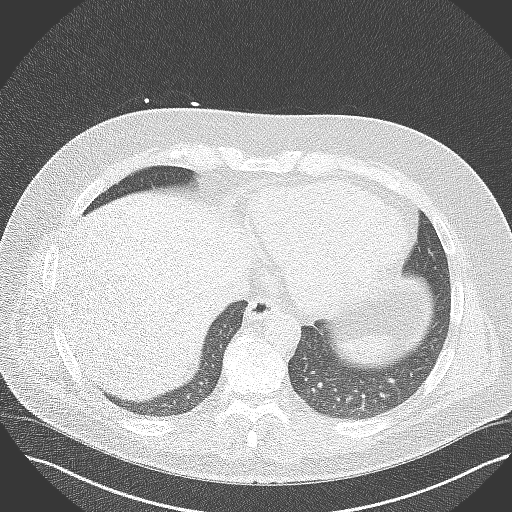
[im 17/49  lung]
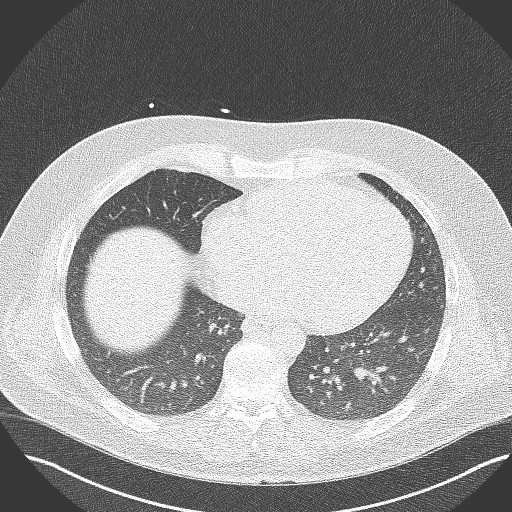
[im 25/49  lung]
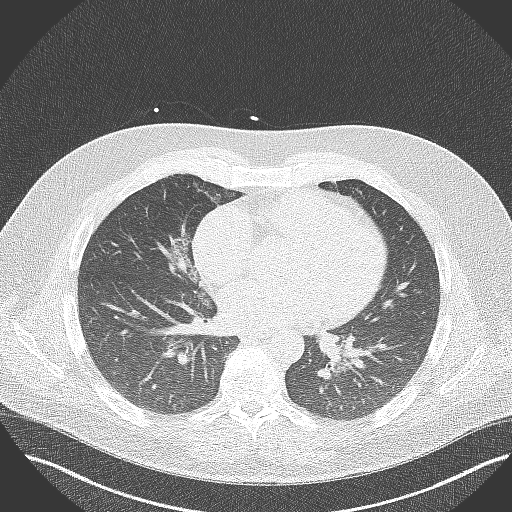
[im 33/49  lung]
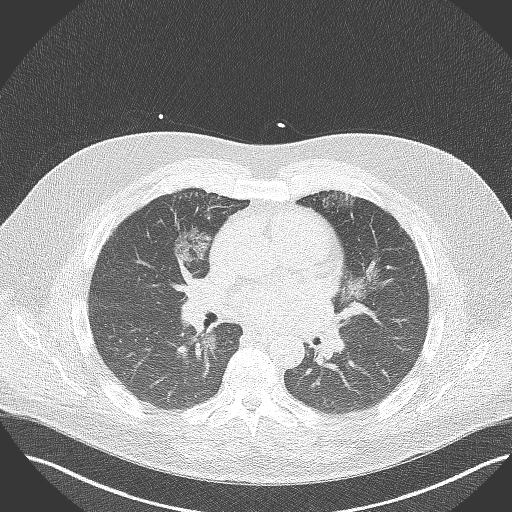
[im 41/49  lung]
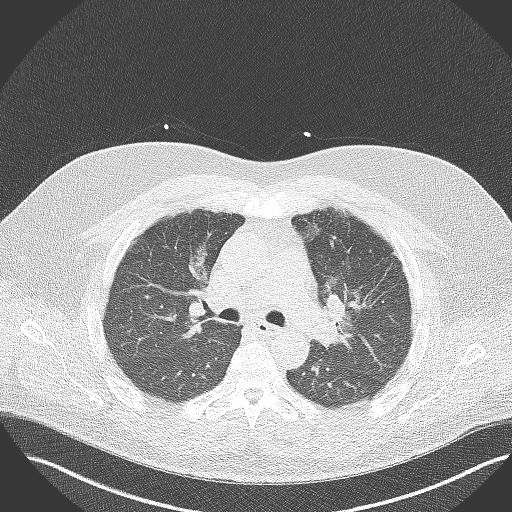

[Series 4: lung st 70 % · axial · 0.80mm/px · z∈[+207,+303]mm · 5 of 49 slices shown]
[im 9/49  lung]
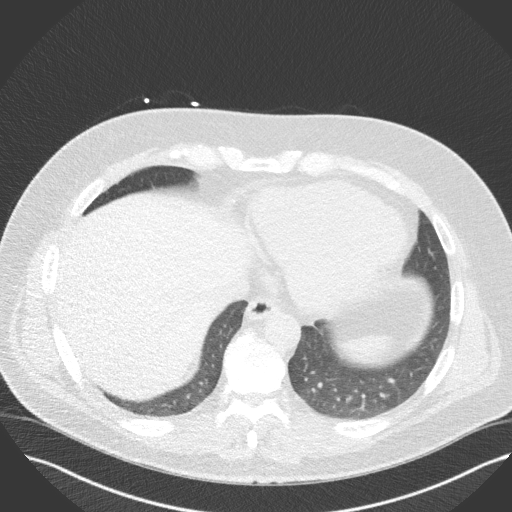
[im 17/49  lung]
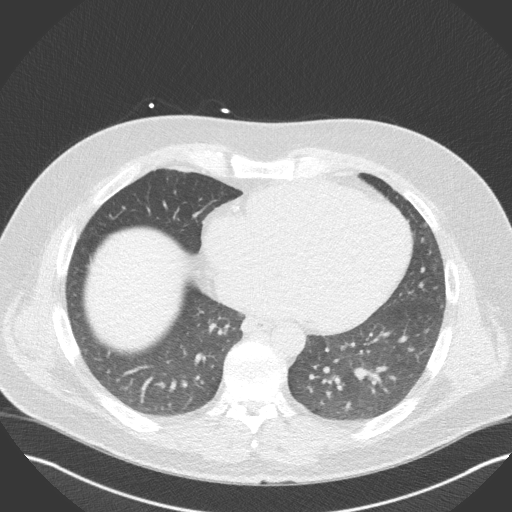
[im 25/49  lung]
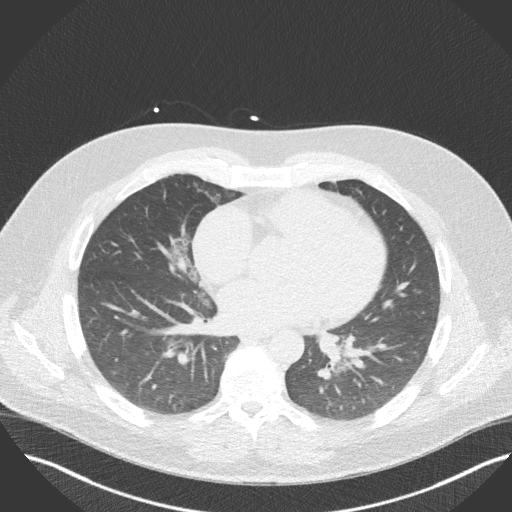
[im 33/49  lung]
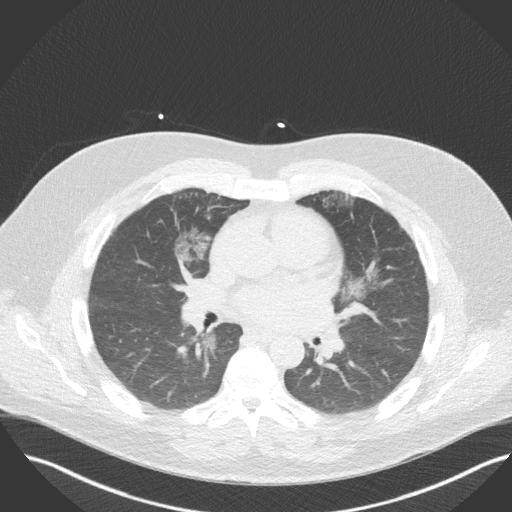
[im 41/49  lung]
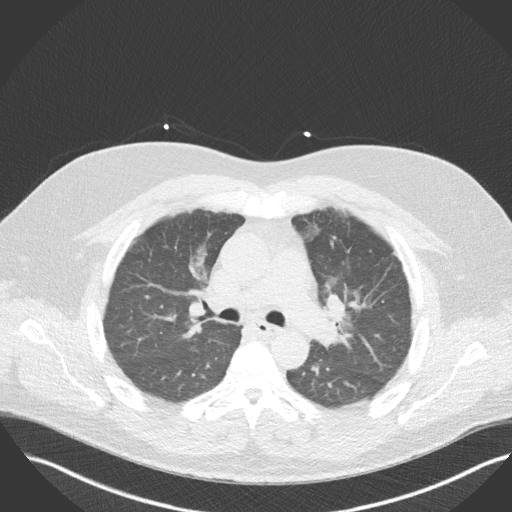

[14 of 20 positions shown; findings below may reference images not displayed]

FINDINGS: Vascular: No incidental vascular findings.

Mediastinum/Nodes: Visualized mediastinum and hilar regions
demonstrate no lymphadenopathy or masses.

Lungs/Pleura: Multiple areas of geographic ground-glass airspace
disease in both upper lobes, the right middle lobe and the lingula.
Findings are most likely consistent with atypical pneumonitis and
particularly COVID pneumonia.

Upper Abdomen: No acute abnormality.

Musculoskeletal: No chest wall mass or suspicious bone lesions
identified.
IMPRESSION: Multiple areas of ground-glass airspace disease in both upper lobes,
the right middle lobe and lingula. Correlation suggested with any
clinical symptoms. Findings are most likely consistent with atypical
pneumonitis and particularly COVID pneumonia.
FINDINGS: Coronary arteries: Normal origins.

Coronary Calcium Score:

Left main: 0

Left anterior descending artery:

Left circumflex artery: 0

Right coronary artery:

Total: 155

Percentile: 92nd

Pericardium: Normal.

Ascending Aorta: Normal caliber.

Non-cardiac: See separate report from [REDACTED].
IMPRESSION: Coronary calcium score of 155. This was 92nd percentile for age-,
race-, and sex-matched controls.



If CAC=0, it is reasonable to withhold statin therapy and reassess
in 5 to 10 years, as long as higher risk conditions are absent
(diabetes mellitus, family history of premature CHD in first degree
relatives (males <55 years; females <65 years), cigarette smoking,
or LDL >=190 mg/dL).

If CAC is 1 to 99, it is reasonable to initiate statin therapy for
patients >=55 years of age.

If CAC is >=100 or >=75th percentile, it is reasonable to initiate
statin therapy at any age.

Cardiology referral should be considered for patients with CAC
scores >=400 or >=75th percentile.

*4639 AHA/ACC/AACVPR/AAPA/ABC/SANDOVAL/EPHESIEN/BAMBOU/[place]/HYSMET QENGAJ/GORENAK/MYRIAM CRISTINA
Guideline on the Management of Blood Cholesterol: A Report of the
American College of Cardiology/American Heart Association Task Force
on Clinical Practice Guidelines. J Am Coll Cardiol.
6185;73(24):3838-3978.

*** End of Addendum ***
EXAM:
OVER-READ INTERPRETATION  CT CHEST

The following report is an over-read performed by radiologist Dr.
Clerio Lv [REDACTED] on 03/01/2021. This
over-read does not include interpretation of cardiac or coronary
anatomy or pathology. The coronary calcium score interpretation by
the cardiologist is attached.
FINDINGS: Vascular: No incidental vascular findings.

Mediastinum/Nodes: Visualized mediastinum and hilar regions
demonstrate no lymphadenopathy or masses.

Lungs/Pleura: Multiple areas of geographic ground-glass airspace
disease in both upper lobes, the right middle lobe and the lingula.
Findings are most likely consistent with atypical pneumonitis and
particularly COVID pneumonia.

Upper Abdomen: No acute abnormality.

Musculoskeletal: No chest wall mass or suspicious bone lesions
identified.
IMPRESSION: Multiple areas of ground-glass airspace disease in both upper lobes,
the right middle lobe and lingula. Correlation suggested with any
clinical symptoms. Findings are most likely consistent with atypical
pneumonitis and particularly COVID pneumonia.

## 2023-06-11 ENCOUNTER — Other Ambulatory Visit: Payer: Self-pay

## 2023-06-12 NOTE — Progress Notes (Signed)
Cardiology Office Note:  .   Date:  06/13/2023  ID:  Derek Ware, DOB Aug 26, 1971, MRN [id number] PCP: Philemon Kingdom, MD  [facility] Providers Cardiologist:  Gypsy Balsam, MD    History of Present Illness: .   Derek Ware is a 52 y.o. male with a past medical history of hypertension, coronary artery disease per coronary calcium score 2022, GERD, dyslipidemia.  02/23/2021 echo EF 55 to 60%, mild MR 03/01/2021 coronary calcium score of 155, 92nd percentile  Evaluated by Dr. Bing Matter on 01/21/2022, was doing well from a cardiac perspective, he was advised to follow-up 1 year.  He presents today for follow-up of his hypertension and coronary artery disease.  He has been doing well from a cardiac perspective, although had some concerns related to erectile dysfunction.  He thought this was initially associated with use of losartan, so he stopped his losartan however this did not improve his symptoms.  There was also some concern with dose of his statin, it appears we started him on 80 mg however his PCP rechecked his FLP and started him on 10 mg of Lipitor.  He remains relatively active although does not participate in any formal exercise. He denies chest pain, palpitations, dyspnea, pnd, orthopnea, n, v, dizziness, syncope, edema, weight gain, or early satiety.   ROS: Review of Systems  All other systems reviewed and are negative.    Studies Reviewed: Marland Kitchen   EKG Interpretation Date/Time:  Friday June 13 2023 08:02:22 EDT Ventricular Rate:  61 PR Interval:  178 QRS Duration:  112 QT Interval:  424 QTC Calculation: 426 R Axis:   78  Text Interpretation: Normal sinus rhythm Incomplete right bundle branch block Cannot rule out Anterior infarct --no changes Abnormal ECG No previous ECGs available Confirmed by Wallis Bamberg [phone]) on 06/13/2023 8:07:26 AM    Cardiac Studies & Procedures       ECHOCARDIOGRAM  ECHOCARDIOGRAM COMPLETE  02/23/2021  Narrative ECHOCARDIOGRAM REPORT    Patient Name:   Derek Ware Date of Exam: 02/23/2021 Medical Rec #:  [id number]       Height:       73.0 in Accession #:    [id number]      Weight:       292.0 lb Date of Birth:  April 23, 1971       BSA:          2.527 m Patient Age:    49 years        BP:           138/70 mmHg Patient Gender: M               HR:           65 bpm. Exam Location:  [place]  Procedure: 2D Echo  Indications:    Chest pain in adult [R07.9 (ICD-10-CM)]; Primary hypertension [I10 (ICD-10-CM)]; Dyslipidemia [E78.5 (ICD-10-CM)]  History:        Patient has no prior history of Echocardiogram examinations. Signs/Symptoms:Chest Pain; Risk Factors:Dyslipidemia and Hypertension.  Sonographer:    Louie Boston Referring Phys: [id number] ROBERT J KRASOWSKI  IMPRESSIONS   1. Left ventricular ejection fraction, by estimation, is 55 to 60%. The left ventricle has normal function. The left ventricle has no regional wall motion abnormalities. Left ventricular diastolic parameters were normal. 2. Right ventricular systolic function is normal. The right ventricular size is normal. There is normal pulmonary artery systolic pressure. 3. The mitral valve is normal in structure. Mild mitral valve  regurgitation. No evidence of mitral stenosis. 4. The aortic valve is normal in structure. Aortic valve regurgitation is not visualized. No aortic stenosis is present. 5. The inferior vena cava is normal in size with greater than 50% respiratory variability, suggesting right atrial pressure of 3 mmHg.  FINDINGS Left Ventricle: Left ventricular ejection fraction, by estimation, is 55 to 60%. The left ventricle has normal function. The left ventricle has no regional wall motion abnormalities. The left ventricular internal cavity size was normal in size. There is no left ventricular hypertrophy. Left ventricular diastolic parameters were normal.  Right Ventricle: The right ventricular size is  normal. No increase in right ventricular wall thickness. Right ventricular systolic function is normal. There is normal pulmonary artery systolic pressure. The tricuspid regurgitant velocity is 2.48 m/s, and with an assumed right atrial pressure of 3 mmHg, the estimated right ventricular systolic pressure is 27.6 mmHg.  Left Atrium: Left atrial size was normal in size.  Right Atrium: Right atrial size was normal in size.  Pericardium: There is no evidence of pericardial effusion.  Mitral Valve: The mitral valve is normal in structure. Mild mitral valve regurgitation. No evidence of mitral valve stenosis.  Tricuspid Valve: The tricuspid valve is normal in structure. Tricuspid valve regurgitation is not demonstrated. No evidence of tricuspid stenosis.  Aortic Valve: The aortic valve is normal in structure. Aortic valve regurgitation is not visualized. No aortic stenosis is present.  Pulmonic Valve: The pulmonic valve was normal in structure. Pulmonic valve regurgitation is not visualized. No evidence of pulmonic stenosis.  Aorta: The aortic root is normal in size and structure.  Venous: The inferior vena cava is normal in size with greater than 50% respiratory variability, suggesting right atrial pressure of 3 mmHg.  IAS/Shunts: No atrial level shunt detected by color flow Doppler.   LEFT VENTRICLE PLAX 2D LVIDd:         5.00 cm  Diastology LVIDs:         2.90 cm  LV e' medial:    5.77 cm/s LV PW:         1.10 cm  LV E/e' medial:  9.9 LV IVS:        1.10 cm  LV e' lateral:   6.96 cm/s LVOT diam:     2.20 cm  LV E/e' lateral: 8.2 LV SV:         81 LV SV Index:   32 LVOT Area:     3.80 cm   RIGHT VENTRICLE             IVC RV S prime:     11.00 cm/s  IVC diam: 1.50 cm TAPSE (M-mode): 2.8 cm  LEFT ATRIUM             Index       RIGHT ATRIUM           Index LA diam:        3.80 cm 1.50 cm/m  RA Area:     18.60 cm LA Vol (A2C):   68.9 ml 27.26 ml/m RA Volume:   47.00 ml  18.60  ml/m LA Vol (A4C):   54.6 ml 21.61 ml/m LA Biplane Vol: 64.1 ml 25.37 ml/m AORTIC VALVE LVOT Vmax:   101.00 cm/s LVOT Vmean:  67.900 cm/s LVOT VTI:    0.212 m  AORTA Ao Root diam: 3.20 cm Ao Asc diam:  3.60 cm Ao Desc diam: 2.30 cm  MITRAL VALVE  TRICUSPID VALVE MV Area (PHT): 3.21 cm    TR Peak grad:   24.6 mmHg MV Decel Time: 236 msec    TR Vmax:        248.00 cm/s MV E velocity: 57.40 cm/s MV A velocity: 47.60 cm/s  SHUNTS MV E/A ratio:  1.21        Systemic VTI:  0.21 m Systemic Diam: 2.20 cm  Gypsy Balsam MD Electronically signed by Gypsy Balsam MD Signature Date/Time: 02/23/2021/5:44:10 PM    Final     CT SCANS  CT CARDIAC SCORING (SELF PAY ONLY) 03/01/2021  Addendum 03/01/2021  4:17 PM ADDENDUM REPORT: 03/01/2021 16:15  CLINICAL DATA:  72M with hypertension, hyperlipidemia and atypical chest pain for cardiovascular disease risk stratification  EXAM: Coronary Calcium Score  TECHNIQUE: A gated, non-contrast computed tomography scan of the heart was performed using 3mm slice thickness. Axial images were analyzed on a dedicated workstation. Calcium scoring of the coronary arteries was performed using the Agatston method.  FINDINGS: Coronary arteries: Normal origins.  Coronary Calcium Score:  Left main: 0  Left anterior descending artery: 77.3  Left circumflex artery: 0  Right coronary artery: 77.5  Total: 155  Percentile: 92nd  Pericardium: Normal.  Ascending Aorta: Normal caliber.  Non-cardiac: See separate report from [facility].  IMPRESSION: Coronary calcium score of 155. This was 92nd percentile for age-, race-, and sex-matched controls.  RECOMMENDATIONS: Coronary artery calcium (CAC) score is a strong predictor of incident coronary heart disease (CHD) and provides predictive information beyond traditional risk factors. CAC scoring is reasonable to use in the decision to withhold, postpone, or  initiate statin therapy in intermediate-risk or selected borderline-risk asymptomatic adults (age 77-75 years and LDL-C >=70 to <190 mg/dL) who do not have diabetes or established atherosclerotic cardiovascular disease (ASCVD).* In intermediate-risk (10-year ASCVD risk >=7.5% to <20%) adults or selected borderline-risk (10-year ASCVD risk >=5% to <7.5%) adults in whom a CAC score is measured for the purpose of making a treatment decision the following recommendations have been made:  If CAC=0, it is reasonable to withhold statin therapy and reassess in 5 to 10 years, as long as higher risk conditions are absent (diabetes mellitus, family history of premature CHD in first degree relatives (males <55 years; females <65 years), cigarette smoking, or LDL >=190 mg/dL).  If CAC is 1 to 99, it is reasonable to initiate statin therapy for patients >=28 years of age.  If CAC is >=100 or >=75th percentile, it is reasonable to initiate statin therapy at any age.  Cardiology referral should be considered for patients with CAC scores >=400 or >=75th percentile.  *2018 AHA/ACC/AACVPR/AAPA/ABC/ACPM/ADA/AGS/APhA/ASPC/NLA/PCNA Guideline on the Management of Blood Cholesterol: A Report of the American College of Cardiology/American Heart Association Task Force on Clinical Practice Guidelines. J Am Coll Cardiol. 2019;73(24):3168-3209.  Derek Si, MD   Electronically Signed By: Derek Ware On: 03/01/2021 16:15  Narrative EXAM: OVER-READ INTERPRETATION  CT CHEST  The following report is an over-read performed by radiologist Dr. Irish Ware of [facility] on 03/01/2021. This over-read does not include interpretation of cardiac or coronary anatomy or pathology. The coronary calcium score interpretation by the cardiologist is attached.  COMPARISON:  None.  FINDINGS: Vascular: No incidental vascular findings.  Mediastinum/Nodes: Visualized mediastinum and hilar  regions demonstrate no lymphadenopathy or masses.  Lungs/Pleura: Multiple areas of geographic ground-glass airspace disease in both upper lobes, the right middle lobe and the lingula. Findings are most likely consistent with atypical pneumonitis and particularly COVID  pneumonia.  Upper Abdomen: No acute abnormality.  Musculoskeletal: No chest wall mass or suspicious bone lesions identified.  IMPRESSION: Multiple areas of ground-glass airspace disease in both upper lobes, the right middle lobe and lingula. Correlation suggested with any clinical symptoms. Findings are most likely consistent with atypical pneumonitis and particularly COVID pneumonia.  Electronically Signed: By: Derek Ware M.D. On: 03/01/2021 14:51          Risk Assessment/Calculations:     HYPERTENSION CONTROL Vitals:   06/13/23 0755 06/13/23 0840  BP: (!) 146/98 (!) 146/98    The patient's blood pressure is elevated above target today.  In order to address the patient's elevated BP: A new medication was prescribed today.          Physical Exam:   VS:  BP (!) 146/98   Pulse 61   Ht 6\' 1"  (1.854 m)   Wt 271 lb 12.8 oz (123.3 kg)   SpO2 94%   BMI 35.86 kg/m    Wt Readings from Last 3 Encounters:  06/13/23 271 lb 12.8 oz (123.3 kg)  01/24/22 282 lb 6.4 oz (128.1 kg)  07/27/21 260 lb 6.4 oz (118.1 kg)    GEN: Well nourished, well developed in no acute distress NECK: No JVD; No carotid bruits CARDIAC: RRR, no murmurs, rubs, gallops RESPIRATORY:  Clear to auscultation without rales, wheezing or rhonchi  ABDOMEN: Soft, non-tender, non-distended EXTREMITIES:  No edema; No deformity   ASSESSMENT AND PLAN: .   Coronary artery disease-elevated calcium score of 155, Stable with no anginal symptoms. No indication for ischemic evaluation.  Continue aspirin 81 mg daily. Heart healthy diet and regular cardiovascular exercise encouraged.   Hypertension-blood pressure is elevated at 146/98, will restart  losartan 25 mg daily.  Will repeat BMET in 2 weeks. Dyslipidemia-LDL elevated at 172 on 01/25/2022, pravastatin was increased at that time to 80, it appears his PCP may have started him on 10 mg of Lipitor and there was some confusion surrounding what he was supposed to take.  Will check his LDL and LFTs today, make a decision on statin therapy after that. Erectile dysfunction-has been happening for a few months, been frustrating for him.  Will start him on Cialis 5 mg p.o. as needed for sexual activity.       Dispo: CMET, dLDL, start Cialis 5 mg PO PRN. Return in 1 year.   Signed, Flossie Dibble, NP

## 2023-06-13 ENCOUNTER — Encounter: Payer: Self-pay | Admitting: Cardiology

## 2023-06-13 ENCOUNTER — Ambulatory Visit: Payer: BC Managed Care – PPO | Attending: Cardiology | Admitting: Cardiology

## 2023-06-13 VITALS — BP 146/98 | HR 61 | Ht 73.0 in | Wt 271.8 lb

## 2023-06-13 DIAGNOSIS — E782 Mixed hyperlipidemia: Secondary | ICD-10-CM

## 2023-06-13 DIAGNOSIS — I10 Essential (primary) hypertension: Secondary | ICD-10-CM | POA: Diagnosis not present

## 2023-06-13 DIAGNOSIS — N529 Male erectile dysfunction, unspecified: Secondary | ICD-10-CM

## 2023-06-13 DIAGNOSIS — I2510 Atherosclerotic heart disease of native coronary artery without angina pectoris: Secondary | ICD-10-CM

## 2023-06-13 MED ORDER — TADALAFIL 5 MG PO TABS: 5.0000 mg | ORAL_TABLET | Freq: Every day | ORAL | 6 refills | Status: AC | PRN

## 2023-06-13 MED ORDER — LOSARTAN POTASSIUM 25 MG PO TABS: 25.0000 mg | ORAL_TABLET | Freq: Every day | ORAL | 3 refills | Status: DC

## 2023-06-13 NOTE — Patient Instructions (Signed)
Medication Instructions:  Your physician has recommended you make the following change in your medication:   Restart Losartan 25 mg daily    *If you need a refill on your cardiac medications before your next appointment, please call your pharmacy*   Lab Work: Your physician recommends that you have a CMP and direct LDL today in the office.  Your physician recommends that you return for lab work in: 2 weeks for BMP You can come Monday through Friday 8:30 am to 12:00 pm and 1:15 to 4:30. You do not need to make an appointment as the order has already been placed.   If you have labs (blood work) drawn today and your tests are completely normal, you will receive your results only by: MyChart Message (if you have MyChart) OR A paper copy in the mail If you have any lab test that is abnormal or we need to change your treatment, we will call you to review the results.   Testing/Procedures: None ordered   Follow-Up: At [facility], you and your health needs are our priority.  As part of our continuing mission to provide you with exceptional heart care, we have created designated Provider Care Teams.  These Care Teams include your primary Cardiologist (physician) and Advanced Practice Providers (APPs -  Physician Assistants and Nurse Practitioners) who all work together to provide you with the care you need, when you need it.  We recommend signing up for the patient portal called "MyChart".  Sign up information is provided on this After Visit Summary.  MyChart is used to connect with patients for Virtual Visits (Telemedicine).  Patients are able to view lab/test results, encounter notes, upcoming appointments, etc.  Non-urgent messages can be sent to your provider as well.   To learn more about what you can do with MyChart, go to [email or web address].    Your next appointment:   12 month(s)  The format for your next appointment:   In Person  Provider:   Belva Crome, MD     Other Instructions none  Important Information About Sugar

## 2023-06-14 LAB — COMPREHENSIVE METABOLIC PANEL WITH GFR
ALT: 34 IU/L (ref 0–44)
AST: 34 IU/L (ref 0–40)
Albumin: 4.3 g/dL (ref 3.8–4.9)
Alkaline Phosphatase: 59 IU/L (ref 44–121)
BUN/Creatinine Ratio: 20 (ref 9–20)
BUN: 20 mg/dL (ref 6–24)
Bilirubin Total: 0.5 mg/dL (ref 0.0–1.2)
CO2: 27 mmol/L (ref 20–29)
Calcium: 9.7 mg/dL (ref 8.7–10.2)
Chloride: 100 mmol/L (ref 96–106)
Creatinine, Ser: 0.99 mg/dL (ref 0.76–1.27)
Globulin, Total: 2 g/dL (ref 1.5–4.5)
Glucose: 100 mg/dL — ABNORMAL HIGH (ref 70–99)
Potassium: 4.6 mmol/L (ref 3.5–5.2)
Sodium: 139 mmol/L (ref 134–144)
Total Protein: 6.3 g/dL (ref 6.0–8.5)
eGFR: 92 mL/min/1.73

## 2023-06-14 LAB — LDL CHOLESTEROL, DIRECT: LDL Direct: 141 mg/dL — ABNORMAL HIGH (ref 0–99)

## 2023-06-16 ENCOUNTER — Telehealth: Payer: Self-pay

## 2023-06-16 DIAGNOSIS — E782 Mixed hyperlipidemia: Secondary | ICD-10-CM

## 2023-06-16 MED ORDER — ATORVASTATIN CALCIUM 40 MG PO TABS: 40.0000 mg | ORAL_TABLET | Freq: Every day | ORAL | 3 refills | Status: DC

## 2023-06-16 NOTE — Telephone Encounter (Signed)
-----   Message from Flossie Dibble sent at 06/15/2023  2:13 PM EDT ----- Cholesterol is too high. I do not think Lipitor 10 mg is going to be enough. Lets do Lipitor 40 mg daily. Return in 8 weeks for FLP and LFTs.

## 2023-06-16 NOTE — Telephone Encounter (Signed)
MyChart msg sent.

## 2023-06-30 ENCOUNTER — Ambulatory Visit (INDEPENDENT_AMBULATORY_CARE_PROVIDER_SITE_OTHER): Payer: BC Managed Care – PPO | Admitting: Urology

## 2023-06-30 ENCOUNTER — Encounter: Payer: Self-pay | Admitting: Urology

## 2023-06-30 VITALS — BP 158/99 | HR 54 | Ht 73.0 in | Wt 270.0 lb

## 2023-06-30 DIAGNOSIS — N529 Male erectile dysfunction, unspecified: Secondary | ICD-10-CM

## 2023-06-30 NOTE — Progress Notes (Signed)
Assessment: 1. Organic impotence     Plan: I personally reviewed the patient's chart including provider notes. Options for management of erectile dysfunction discussed with the patient today.  Due to his history of anterior ischemic optic neuropathy, I recommended that he avoid the phosphodiesterase inhibitors due to the potential association of ischemic optic neuropathy and these medications.  I discussed alternative therapy such as vacuum erection device, intracavernosal penile injections, intraurethral suppository therapy, and penile prosthesis. Information was provided to the patient. At the present time he will consider his options and let me know if he wishes to pursue any of these treatment modalities. Return to office as needed.   Chief Complaint:  Chief Complaint  Patient presents with   Erectile Dysfunction    History of Present Illness: Derek Ware is a 52 y.o. male who is seen in consultation from Wallis Bamberg, NP for evaluation of erectile dysfunction.  He reports symptoms for approximately 2 years.  He is able to achieve a partial erection with 70% rigidity and is able to penetrate.  He does have problems with rapid detumescence.  No pain or curvature with erections.  He does have occasional early morning erections.  No decrease in his libido. He was diagnosed with anterior ischemic optic neuropathy approximately 3 years ago.  He was advised not to use phosphodiesterase inhibitors after that diagnosis due to the potential association with these medications.  He does not have any lower urinary tract symptoms at the present time. He has a history of a urethral stricture undergoing dilation by Dr. Tera Partridge about 10 years ago.  Past Medical History:  Past Medical History:  Diagnosis Date   Chest pain in adult 07/18/2016   Coronary artery disease calcium score 155, asymptomatic 03/28/2021   Dyslipidemia 02/13/2021   Elevated LDL cholesterol level    Erectile  dysfunction    Essential hypertension    GERD without esophagitis    Hypertension 07/18/2016   Left atrial enlargement    Precancerous skin lesion     Past Surgical History:  Past Surgical History:  Procedure Laterality Date   NO PAST SURGERIES      Allergies:  No Known Allergies  Family History:  Family History  Problem Relation Age of Onset   Heart failure Mother    Heart disease Mother    Hypertension Father    Lung cancer Father    Irritable bowel syndrome Sister    Cancer Brother     Social History:  Social History   Tobacco Use   Smoking status: Never   Smokeless tobacco: Never  Substance Use Topics   Alcohol use: Yes    Alcohol/week: 1.0 standard drink of alcohol    Types: 1 Cans of beer per week   Drug use: Never    Review of symptoms:  Constitutional:  Negative for unexplained weight loss, night sweats, fever, chills ENT:  Negative for nose bleeds, sinus pain, painful swallowing CV:  Negative for chest pain, shortness of breath, exercise intolerance, palpitations, loss of consciousness Resp:  Negative for cough, wheezing, shortness of breath GI:  Negative for nausea, vomiting, diarrhea, bloody stools GU:  Positives noted in HPI; otherwise negative for gross hematuria, dysuria, urinary incontinence Neuro:  Negative for seizures, poor balance, limb weakness, slurred speech Psych:  Negative for lack of energy, depression, anxiety Endocrine:  Negative for polydipsia, polyuria, symptoms of hypoglycemia (dizziness, hunger, sweating) Hematologic:  Negative for anemia, purpura, petechia, prolonged or excessive bleeding, use of anticoagulants  Allergic:  Negative for difficulty breathing or choking as a result of exposure to anything; no shellfish allergy; no allergic response (rash/itch) to materials, foods  Physical exam: BP (!) 158/99   Pulse (!) 54   Ht 6\' 1"  (1.854 m)   Wt 270 lb (122.5 kg)   BMI 35.62 kg/m  GENERAL APPEARANCE:  Well appearing, well  developed, well nourished, NAD HEENT: Atraumatic, Normocephalic, oropharynx clear. NECK: Supple without lymphadenopathy or thyromegaly. LUNGS: Clear to auscultation bilaterally. HEART: Regular Rate and Rhythm without murmurs, gallops, or rubs. ABDOMEN: Soft, non-tender, No Masses. EXTREMITIES: Moves all extremities well.  Without clubbing, cyanosis, or edema. NEUROLOGIC:  Alert and oriented x 3, normal gait, CN II-XII grossly intact.  MENTAL STATUS:  Appropriate. BACK:  Non-tender to palpation.  No CVAT SKIN:  Warm, dry and intact.   GU: Penis:  circumcised Meatus: Normal Scrotum: normal, no masses Testis: normal without masses bilateral   Results: None

## 2023-11-26 LAB — HEPATIC FUNCTION PANEL
ALT: 29 IU/L (ref 0–44)
AST: 26 IU/L (ref 0–40)
Albumin: 3.9 g/dL (ref 3.8–4.9)
Alkaline Phosphatase: 55 IU/L (ref 44–121)
Bilirubin Total: 0.5 mg/dL (ref 0.0–1.2)
Bilirubin, Direct: 0.17 mg/dL (ref 0.00–0.40)
Total Protein: 6.1 g/dL (ref 6.0–8.5)

## 2023-11-26 LAB — LIPID PANEL
Chol/HDL Ratio: 3.2 ratio (ref 0.0–5.0)
Cholesterol, Total: 128 mg/dL (ref 100–199)
HDL: 40 mg/dL
LDL Chol Calc (NIH): 71 mg/dL (ref 0–99)
Triglycerides: 89 mg/dL (ref 0–149)
VLDL Cholesterol Cal: 17 mg/dL (ref 5–40)

## 2024-11-17 ENCOUNTER — Telehealth: Payer: Self-pay | Admitting: Cardiology

## 2024-11-17 DIAGNOSIS — E782 Mixed hyperlipidemia: Secondary | ICD-10-CM

## 2024-11-17 NOTE — Telephone Encounter (Signed)
" °*  STAT* If patient is at the pharmacy, call can be transferred to refill team.   1. Which medications need to be refilled? (please list name of each medication and dose if known)    losartan  (COZAAR ) 25 MG tablet    atorvastatin  (LIPITOR) 40 MG tablet    2. Would you like to learn more about the convenience, safety, & potential cost savings by using the [facility]? no   3. Are you open to using the [facility] (Type [facility]. no   4. Which pharmacy/location (including street and city if local pharmacy) is medication to be sent to?  [facility] - [place], [place] - [address]     5. Do they need a 30 day or 90 day supply?  90 day    Pt is completely out.  "

## 2024-11-18 MED ORDER — LOSARTAN POTASSIUM 25 MG PO TABS: 25.0000 mg | ORAL_TABLET | Freq: Every day | ORAL | 0 refills | Status: AC

## 2024-11-18 MED ORDER — ATORVASTATIN CALCIUM 40 MG PO TABS: 40.0000 mg | ORAL_TABLET | Freq: Every day | ORAL | 0 refills | Status: AC

## 2024-11-18 NOTE — Telephone Encounter (Signed)
 Refills has been sent to the pharmacy.

## 2025-01-21 ENCOUNTER — Ambulatory Visit: Admitting: Cardiology
